# Patient Record
Sex: Female | Born: 1970 | Race: Black or African American | Hispanic: No | Marital: Married
Health system: Southern US, Community
[De-identification: ages and names within clinical notes are randomized; demographics above are authoritative.]

---

## 2002-03-31 ENCOUNTER — Other Ambulatory Visit: Admission: RE | Admit: 2002-03-31 | Discharge: 2002-03-31 | Payer: Self-pay | Admitting: Obstetrics and Gynecology

## 2004-09-19 ENCOUNTER — Encounter: Admission: RE | Admit: 2004-09-19 | Discharge: 2004-09-19 | Payer: Self-pay

## 2004-09-26 ENCOUNTER — Encounter: Admission: RE | Admit: 2004-09-26 | Discharge: 2004-09-26 | Payer: Self-pay | Admitting: Internal Medicine

## 2005-06-30 IMAGING — CR DG ABDOMEN 1V
1 series · 1 of 1 positions shown · non-contrast
Comparison: none

CLINICAL DATA: Painful knot just above the naval for approximately two days.  Had laparoscopic abdominal surgery in [id number].  
 SINGLE VIEW ABDOMEN
 A single view of the abdomen was made without previous films for comparison and shows a moderate amount of fecal material within the right and left colon.  No evidence of obstruction or perforation of the bowel is seen.  There are bilateral pelvic phleboliths.  There are no renal calculi seen.  The lumbar spine and pelvis appear normal. 
 IMPRESSION
 1.  Moderate amounts of fecal material, right and left colon.  No evidence of obstruction or perforation of the bowel.  No abnormal calcification or mass is identified. 
 2.  Possibility of an anterior wall hernia should be considered clinically.

[view not recorded]
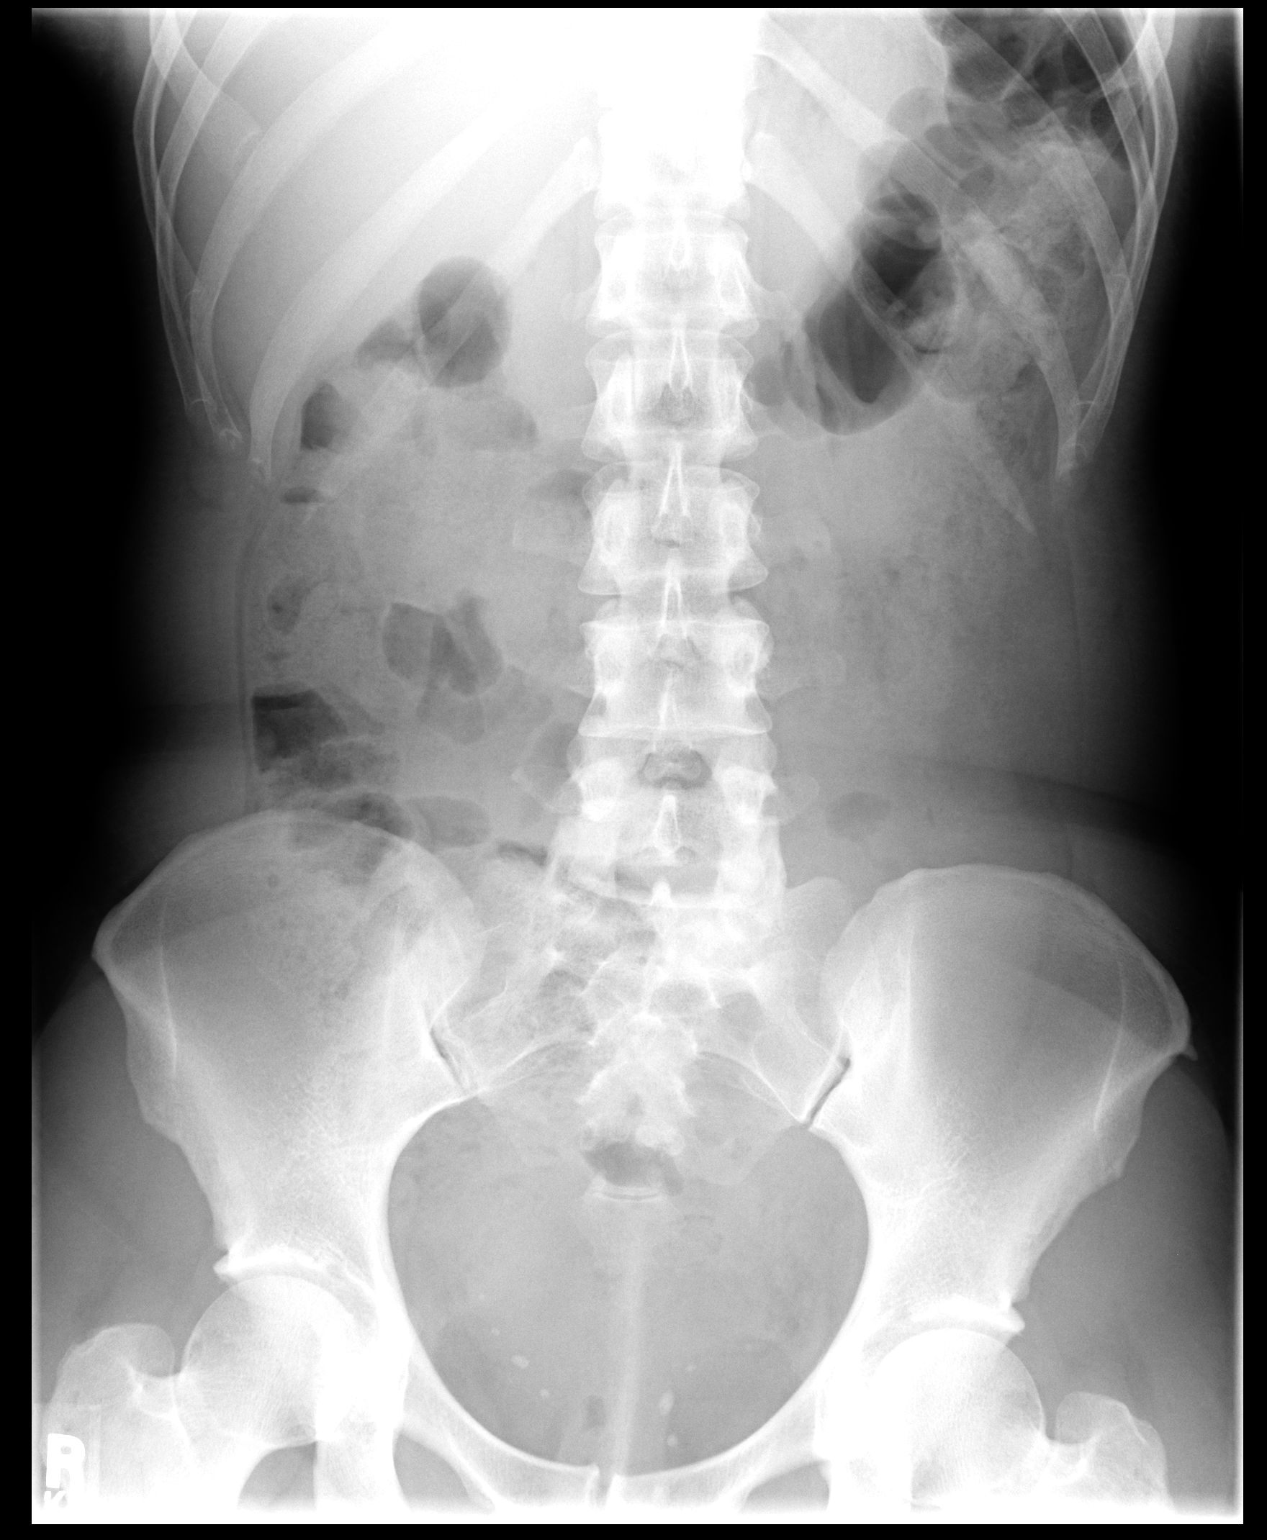

[1 of 1 positions shown; findings below may reference images not displayed]

## 2005-07-07 IMAGING — US US TRANSVAGINAL NON-OB
1 series · 13 of 25 positions shown · non-contrast
Comparison: none

CLINICAL DATA: ULTRASOUND OF THE PELVIS ? 09/26/04:
 Comparison to a [HOSPITAL] CT of the abdomen and pelvis dated 09/20/04, which has been provided on a CD-ROM.
TECHNIQUE: Transabdominal and endovaginal scanning of the anatomic pelvis was performed.

[Series 1: unknown · 0.28mm/px · 13 of 55 slices shown]
[im 1/55]
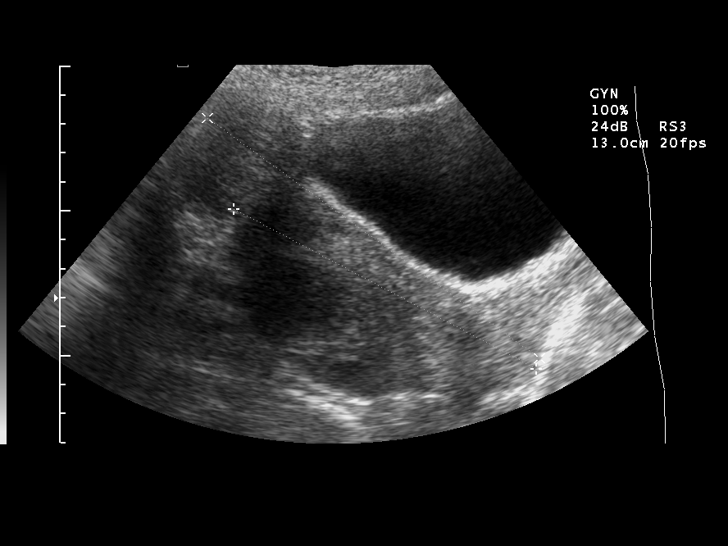
[im 5/55]
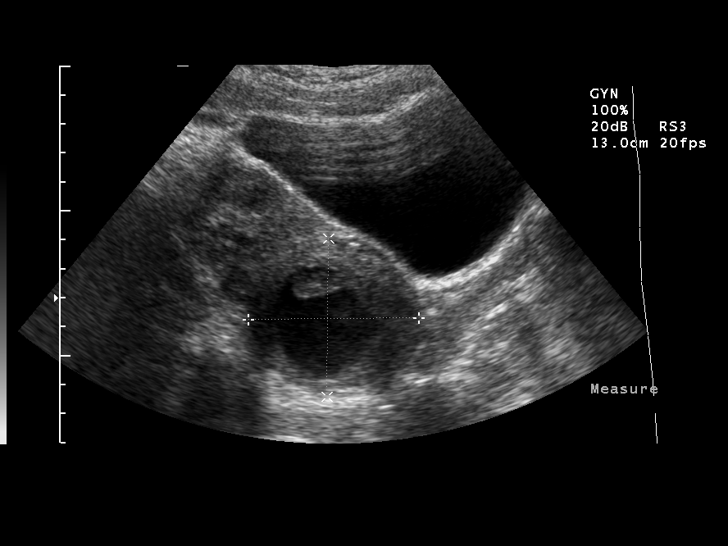
[im 10/55]
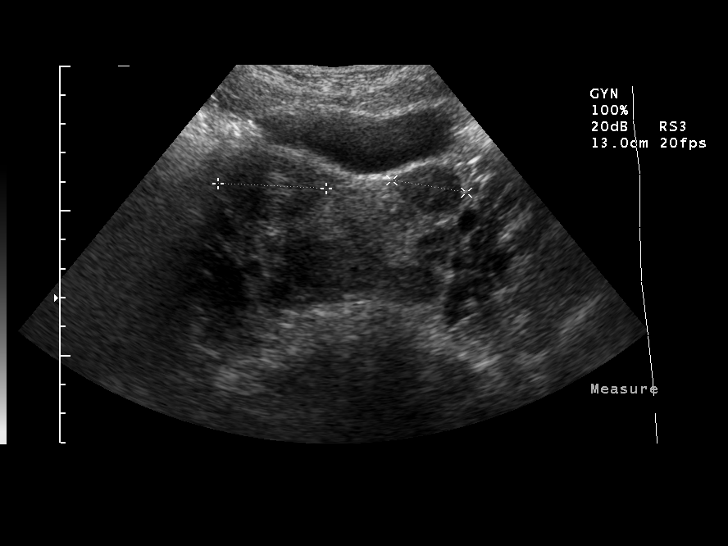
[im 14/55]
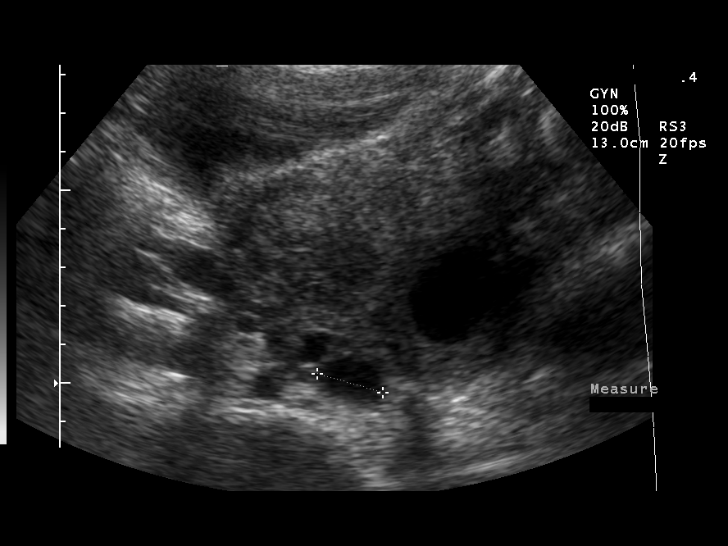
[im 19/55]
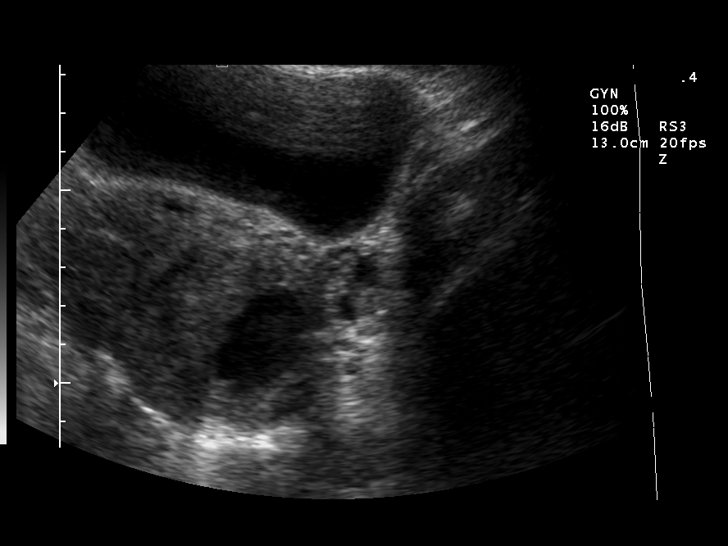
[im 23/55]
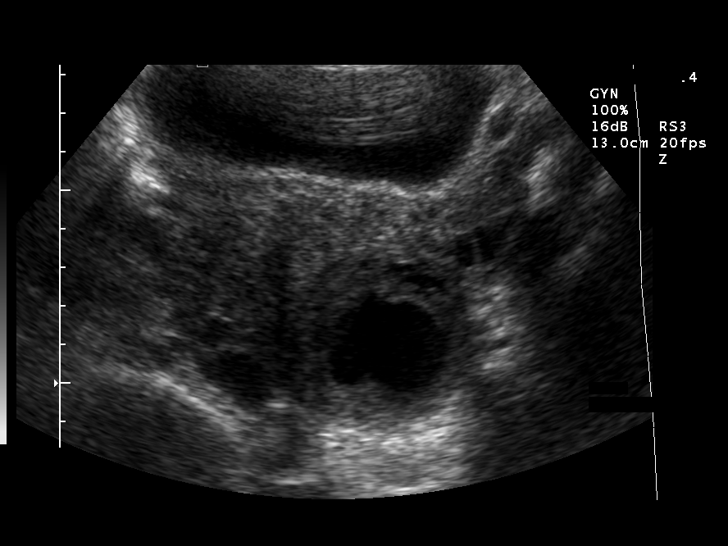
[im 28/55]
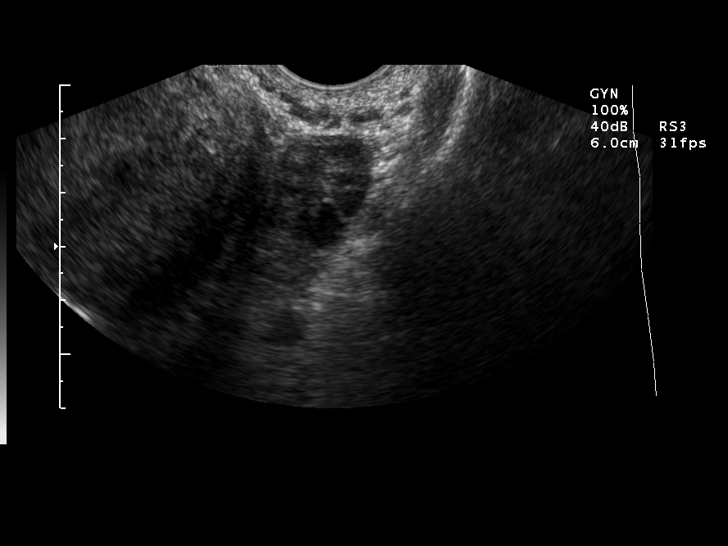
[im 32/55]
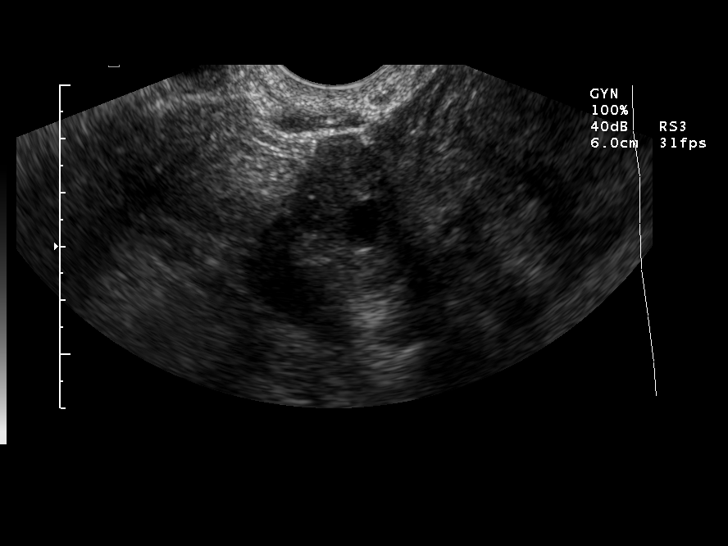
[im 37/55]
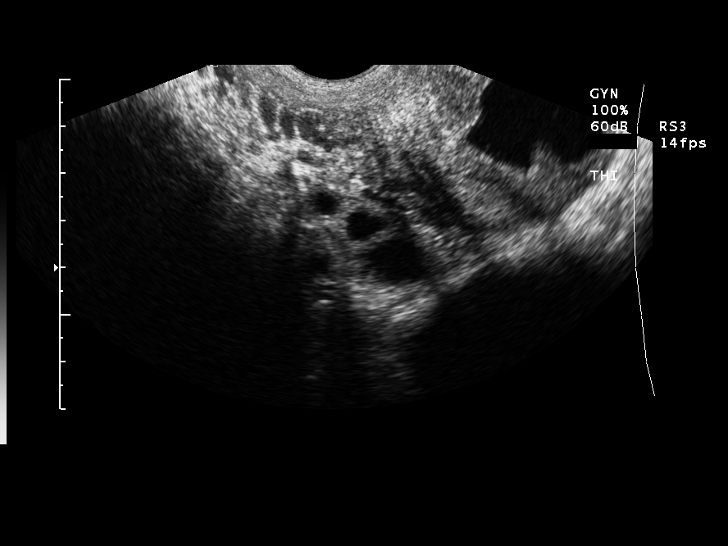
[im 41/55]
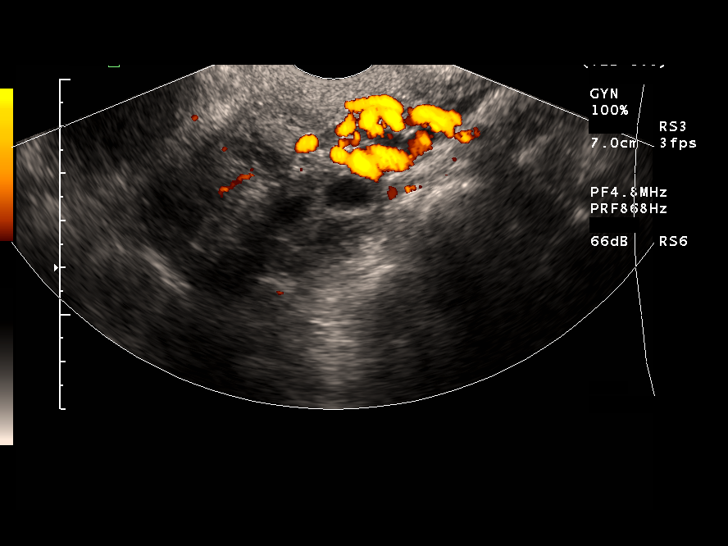
[im 46/55]
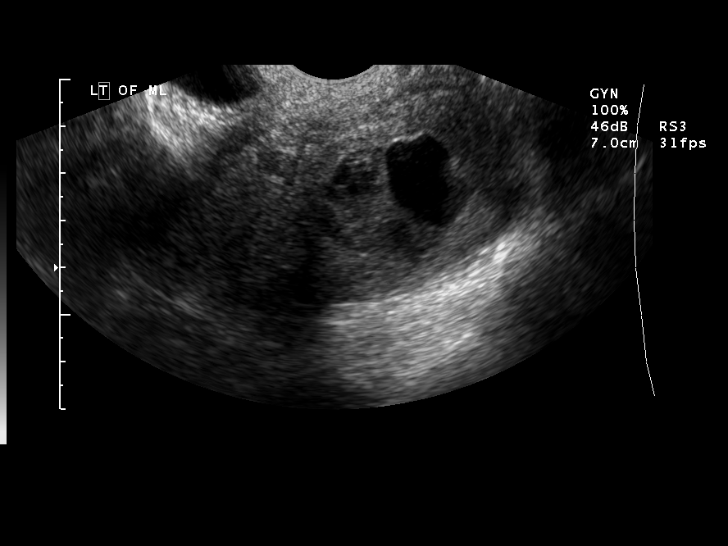
[im 50/55]
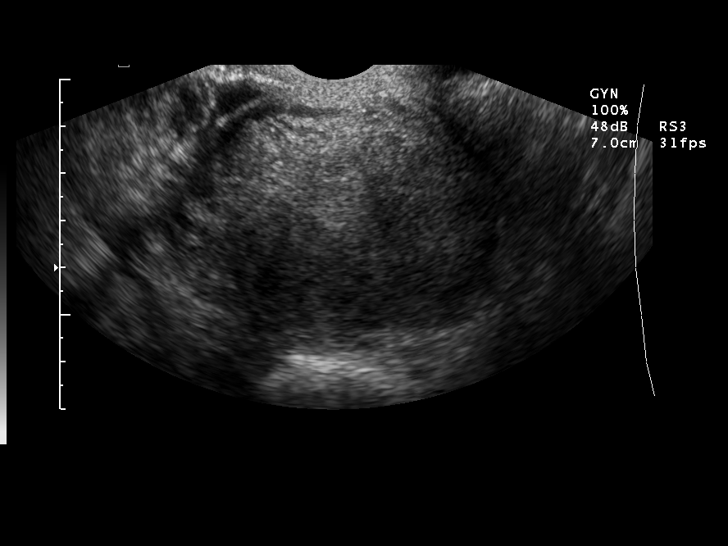
[im 55/55]
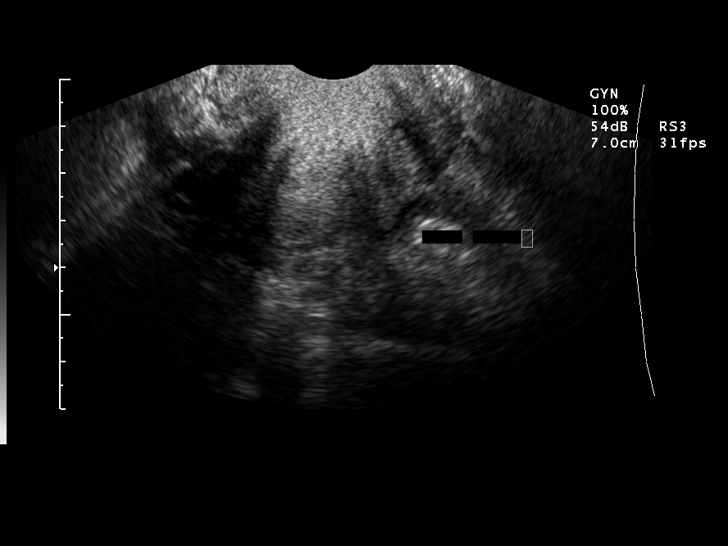

[13 of 25 positions shown; findings below may reference images not displayed]

FINDINGS: The uterus is markedly enlarged measuring 14.1 x 5.3 x 8.2 cm transabdominally.  Numerous intramural fibroids are identified, the largest of which is in the posterior mid uterus and measures 5.9 cm, having a prominent central cystic component.  A pedunculated 5.1 cm fibroid extends from the fundus.  Multiple other fundal smaller intramural fibroids are apparent.  Most of the fibroid change is best assessed transabdominally given the far field position relative to the endovaginal probe.  
 The large fibroid burden makes visualization of the endometrial stripe difficult and it may measure in the 4 mm range although this difficult to confidently assess.
 The right ovary measures 3.5 x 2.1 x 3.4 cm.  The left ovary measures 2.6 x 2.2 x 1.7 cm.  Both are sonographically normal with multiple visualized follicles.
 Trace amount of free fluid is identified in the peritoneal cavity.
IMPRESSION: 1.  Multiple uterine fibroids, including a 5.9 cm intramural posterior fibroid having central cystic change and a pedunculated 5.5 cm fibroid from the fundus.

## 2019-07-21 ENCOUNTER — Other Ambulatory Visit: Payer: Self-pay

## 2019-07-21 DIAGNOSIS — Z20822 Contact with and (suspected) exposure to covid-19: Secondary | ICD-10-CM

## 2019-07-22 LAB — NOVEL CORONAVIRUS, NAA: SARS-CoV-2, NAA: NOT DETECTED

## 2019-07-26 ENCOUNTER — Telehealth: Payer: Self-pay

## 2019-07-26 NOTE — Telephone Encounter (Signed)
Negative COVID results given. Patient results "NOT Detected." Caller expressed understanding. ° °
